# Patient Record
Sex: Female | Born: 2000 | Race: Asian | Hispanic: No | Marital: Single | Smoking: Never smoker
Health system: Southern US, Community
[De-identification: ages and names within clinical notes are randomized; demographics above are authoritative.]

---
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
## 2021-10-11 ENCOUNTER — Encounter: Payer: Self-pay | Admitting: Nurse Practitioner

## 2023-07-24 ENCOUNTER — Other Ambulatory Visit: Payer: Self-pay

## 2023-07-24 ENCOUNTER — Encounter (HOSPITAL_COMMUNITY): Payer: Self-pay

## 2023-07-24 ENCOUNTER — Emergency Department (HOSPITAL_COMMUNITY)
Admission: EM | Admit: 2023-07-24 | Discharge: 2023-07-25 | Disposition: A | Discharge status: Home / Self Care | Payer: Self-pay | Attending: Emergency Medicine | Admitting: Emergency Medicine

## 2023-07-24 DIAGNOSIS — Y92410 Unspecified street and highway as the place of occurrence of the external cause: Secondary | ICD-10-CM | POA: Insufficient documentation

## 2023-07-24 DIAGNOSIS — S0181XA Laceration without foreign body of other part of head, initial encounter: Secondary | ICD-10-CM | POA: Insufficient documentation

## 2023-07-24 DIAGNOSIS — S0990XA Unspecified injury of head, initial encounter: Secondary | ICD-10-CM | POA: Diagnosis present
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
## 2023-07-24 NOTE — Discharge Instructions (Addendum)
 The glue should fall off of your laceration in about a week.  You may find places that are sore over the next 1-2 days.  If so, apply ice to those areas.  You may take acetaminophen and/or ibuprofen as needed for pain.
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
